# Patient Record
Sex: Female | Born: 1965 | Race: Black or African American | Hispanic: No | Marital: Single | Smoking: Never smoker
Health system: Southern US, Community
[De-identification: ages and names within clinical notes are randomized; demographics above are authoritative.]

## PROBLEM LIST (undated history)

## (undated) DIAGNOSIS — I10 Essential (primary) hypertension: Secondary | ICD-10-CM

## (undated) DIAGNOSIS — E7800 Pure hypercholesterolemia, unspecified: Secondary | ICD-10-CM

## (undated) DIAGNOSIS — E119 Type 2 diabetes mellitus without complications: Secondary | ICD-10-CM

---

## 2019-07-25 ENCOUNTER — Encounter (INDEPENDENT_AMBULATORY_CARE_PROVIDER_SITE_OTHER): Payer: BC Managed Care – PPO | Admitting: Ophthalmology

## 2019-07-25 DIAGNOSIS — H2513 Age-related nuclear cataract, bilateral: Secondary | ICD-10-CM

## 2019-07-25 DIAGNOSIS — E113313 Type 2 diabetes mellitus with moderate nonproliferative diabetic retinopathy with macular edema, bilateral: Secondary | ICD-10-CM | POA: Diagnosis not present

## 2019-07-25 DIAGNOSIS — E11311 Type 2 diabetes mellitus with unspecified diabetic retinopathy with macular edema: Secondary | ICD-10-CM | POA: Diagnosis not present

## 2019-07-25 DIAGNOSIS — H35033 Hypertensive retinopathy, bilateral: Secondary | ICD-10-CM | POA: Diagnosis not present

## 2019-07-25 DIAGNOSIS — H43813 Vitreous degeneration, bilateral: Secondary | ICD-10-CM

## 2019-07-25 DIAGNOSIS — I10 Essential (primary) hypertension: Secondary | ICD-10-CM

## 2019-07-25 DIAGNOSIS — D3132 Benign neoplasm of left choroid: Secondary | ICD-10-CM

## 2019-09-08 ENCOUNTER — Emergency Department (HOSPITAL_BASED_OUTPATIENT_CLINIC_OR_DEPARTMENT_OTHER)
Admission: EM | Admit: 2019-09-08 | Discharge: 2019-09-09 | Disposition: A | Discharge status: Home / Self Care | Payer: BC Managed Care – PPO | Attending: Emergency Medicine | Admitting: Emergency Medicine

## 2019-09-08 ENCOUNTER — Emergency Department (HOSPITAL_BASED_OUTPATIENT_CLINIC_OR_DEPARTMENT_OTHER): Payer: BC Managed Care – PPO

## 2019-09-08 ENCOUNTER — Encounter (HOSPITAL_BASED_OUTPATIENT_CLINIC_OR_DEPARTMENT_OTHER): Payer: Self-pay | Admitting: Emergency Medicine

## 2019-09-08 ENCOUNTER — Other Ambulatory Visit: Payer: Self-pay

## 2019-09-08 DIAGNOSIS — R509 Fever, unspecified: Secondary | ICD-10-CM

## 2019-09-08 DIAGNOSIS — E119 Type 2 diabetes mellitus without complications: Secondary | ICD-10-CM | POA: Insufficient documentation

## 2019-09-08 DIAGNOSIS — U071 COVID-19: Secondary | ICD-10-CM | POA: Diagnosis not present

## 2019-09-08 DIAGNOSIS — I10 Essential (primary) hypertension: Secondary | ICD-10-CM | POA: Insufficient documentation

## 2019-09-08 HISTORY — DX: Pure hypercholesterolemia, unspecified: E78.00

## 2019-09-08 HISTORY — DX: Type 2 diabetes mellitus without complications: E11.9

## 2019-09-08 HISTORY — DX: Essential (primary) hypertension: I10

## 2019-09-08 LAB — CBC WITH DIFFERENTIAL/PLATELET
Abs Immature Granulocytes: 0.01 10*3/uL (ref 0.00–0.07)
Basophils Absolute: 0 10*3/uL (ref 0.0–0.1)
Basophils Relative: 0 %
Eosinophils Absolute: 0 10*3/uL (ref 0.0–0.5)
Eosinophils Relative: 0 %
HCT: 35.4 % — ABNORMAL LOW (ref 36.0–46.0)
Hemoglobin: 11.4 g/dL — ABNORMAL LOW (ref 12.0–15.0)
Immature Granulocytes: 0 %
Lymphocytes Relative: 28 %
Lymphs Abs: 1.4 10*3/uL (ref 0.7–4.0)
MCH: 26.1 pg (ref 26.0–34.0)
MCHC: 32.2 g/dL (ref 30.0–36.0)
MCV: 81 fL (ref 80.0–100.0)
Monocytes Absolute: 0.3 10*3/uL (ref 0.1–1.0)
Monocytes Relative: 5 %
Neutro Abs: 3.2 10*3/uL (ref 1.7–7.7)
Neutrophils Relative %: 67 %
Platelets: 185 10*3/uL (ref 150–400)
RBC: 4.37 MIL/uL (ref 3.87–5.11)
RDW: 12.3 % (ref 11.5–15.5)
WBC: 4.9 10*3/uL (ref 4.0–10.5)
nRBC: 0 % (ref 0.0–0.2)

## 2019-09-08 LAB — COMPREHENSIVE METABOLIC PANEL
ALT: 14 U/L (ref 0–44)
AST: 18 U/L (ref 15–41)
Albumin: 3.8 g/dL (ref 3.5–5.0)
Alkaline Phosphatase: 54 U/L (ref 38–126)
Anion gap: 10 (ref 5–15)
BUN: 22 mg/dL — ABNORMAL HIGH (ref 6–20)
CO2: 24 mmol/L (ref 22–32)
Calcium: 9 mg/dL (ref 8.9–10.3)
Chloride: 103 mmol/L (ref 98–111)
Creatinine, Ser: 0.97 mg/dL (ref 0.44–1.00)
GFR calc Af Amer: >60 mL/min (ref >60)
GFR calc non Af Amer: >60 mL/min (ref >60)
Glucose, Bld: 167 mg/dL — ABNORMAL HIGH (ref 70–99)
Potassium: 4 mmol/L (ref 3.5–5.1)
Sodium: 137 mmol/L (ref 135–145)
Total Bilirubin: 0.4 mg/dL (ref 0.3–1.2)
Total Protein: 7.5 g/dL (ref 6.5–8.1)

## 2019-09-08 LAB — URINALYSIS, ROUTINE W REFLEX MICROSCOPIC
Bilirubin Urine: NEGATIVE
Glucose, UA: NEGATIVE mg/dL
Ketones, ur: NEGATIVE mg/dL
Leukocytes,Ua: NEGATIVE
Nitrite: NEGATIVE
Protein, ur: NEGATIVE mg/dL
Specific Gravity, Urine: 1.025 (ref 1.005–1.030)
pH: 5.5 (ref 5.0–8.0)

## 2019-09-08 LAB — CBG MONITORING, ED: Glucose-Capillary: 127 mg/dL — ABNORMAL HIGH (ref 70–99)

## 2019-09-08 LAB — SARS CORONAVIRUS 2 AG (30 MIN TAT): SARS Coronavirus 2 Ag: POSITIVE — AB

## 2019-09-08 LAB — PREGNANCY, URINE: Preg Test, Ur: NEGATIVE

## 2019-09-08 LAB — LACTIC ACID, PLASMA: Lactic Acid, Venous: 1.4 mmol/L (ref 0.5–1.9)

## 2019-09-08 LAB — URINALYSIS, MICROSCOPIC (REFLEX): WBC, UA: NONE SEEN WBC/hpf (ref 0–5)

## 2019-09-08 MED ORDER — ACETAMINOPHEN 160 MG/5ML PO SOLN
650.0000 mg | Freq: Once | ORAL | Status: AC
  Administered 2019-09-08: 17:00:00 650 mg via ORAL
  Filled 2019-09-08: qty 20.3

## 2019-09-08 MED ORDER — ONDANSETRON 4 MG PO TBDP: 4.0000 mg | ORAL_TABLET | Freq: Three times a day (TID) | ORAL | 0 refills | Status: AC | PRN

## 2019-09-08 MED ORDER — SODIUM CHLORIDE 0.9% FLUSH
3.0000 mL | Freq: Once | INTRAVENOUS | Status: DC
  Filled 2019-09-08: qty 3

## 2019-09-08 MED ORDER — ACETAMINOPHEN 500 MG PO TABS
1000.0000 mg | ORAL_TABLET | Freq: Once | ORAL | Status: AC
  Administered 2019-09-08: 1000 mg via ORAL
  Filled 2019-09-08: qty 2

## 2019-09-08 NOTE — Discharge Instructions (Addendum)
Your COVID test was positive.  I recommended that you self quarantine until you are symptom-free for 5 days.  This may take 2 to 4 weeks from the onset of your symptoms.  You can continue taking Tylenol or Motrin at home for your fevers and muscle aches.  I also prescribed you some Zofran if you need this for nausea.

## 2019-09-08 NOTE — ED Triage Notes (Addendum)
Hx of diabetes. High blood sugar readings at home in the 300s. C/o weakness. Also reports fever and body aches.

## 2019-09-08 NOTE — ED Provider Notes (Signed)
[facility] EMERGENCY DEPARTMENT Provider Note   CSN: [id number] Arrival date & time: 09/08/19  1637     History Chief Complaint  Patient presents with  . Hyperglycemia  . Fever    Cassandra Navarro is a 54 y.o. female   With a history of pancreatic surgery, type 2 diabetes on insulin, presenting to the emergency department with fevers and chills.  She reports symptom onset approximately 5 days ago.  She has been having shaking chills and fevers, feels some congestion and a headache, as well as myalgias.  She reports some intermittent cramping abdominal pain scattered all over.  She denies any nausea or vomiting.  She denies any diarrhea or constipation.  She denies any shortness of breath.  She has a mild cough and a tickle in the back of her throat.  She is concerned her BS was over 300 today.  She did take her novalog as scheduled and is regular with her insulin.   HPI     Past Medical History:  Diagnosis Date  . Diabetes mellitus without complication ([place])   . High cholesterol   . Hypertension     There are no problems to display for this patient.   History reviewed. No pertinent surgical history.   OB History   No obstetric history on file.     No family history on file.  Social History   Tobacco Use  . Smoking status: Never Smoker  . Smokeless tobacco: Never Used  Substance Use Topics  . Alcohol use: Not Currently  . Drug use: Not on file    Home Medications Prior to Admission medications   Medication Sig Start Date End Date Taking? Authorizing Provider  ondansetron (ZOFRAN ODT) 4 MG disintegrating tablet Take 1 tablet (4 mg total) by mouth every 8 (eight) hours as needed for up to 20 doses for nausea or vomiting. 09/08/19   Wyvonnia Dusky, MD    Allergies    Patient has no known allergies.  Review of Systems   Review of Systems  Constitutional: Positive for chills, fatigue and fever.  HENT: Positive for congestion. Negative for sore  throat.   Eyes: Negative for photophobia and visual disturbance.  Respiratory: Negative for cough and shortness of breath.   Cardiovascular: Negative for chest pain and palpitations.  Gastrointestinal: Positive for abdominal pain. Negative for vomiting.  Genitourinary: Negative for dysuria and hematuria.  Musculoskeletal: Positive for arthralgias and myalgias.  Skin: Negative for color change and rash.  Neurological: Positive for headaches. Negative for syncope.  Psychiatric/Behavioral: Negative for agitation and confusion.  All other systems reviewed and are negative.   Physical Exam Updated Vital Signs BP (!) 166/84 (BP Location: Right Arm)   Pulse (!) 106   Temp (!) 101.2 F (38.4 C) (Oral)   Resp 20   Ht 5\' 7"  (1.702 m)   Wt 72.6 kg   SpO2 97%   BMI 25.06 kg/m   Physical Exam Vitals and nursing note reviewed.  Constitutional:      General: She is not in acute distress.    Appearance: She is well-developed.  HENT:     Head: Normocephalic and atraumatic.  Eyes:     Conjunctiva/sclera: Conjunctivae normal.  Cardiovascular:     Rate and Rhythm: Normal rate and regular rhythm.  Pulmonary:     Effort: Pulmonary effort is normal. No respiratory distress.  Abdominal:     General: There is no distension.     Palpations: Abdomen is soft. There  is no mass.     Tenderness: There is no abdominal tenderness.     Hernia: No hernia is present.  Musculoskeletal:     Cervical back: Neck supple.  Skin:    General: Skin is warm and dry.  Neurological:     Mental Status: She is alert.  Psychiatric:        Mood and Affect: Mood normal.        Behavior: Behavior normal.     ED Results / Procedures / Treatments   Labs (all labs ordered are listed, but only abnormal results are displayed) Labs Reviewed  SARS CORONAVIRUS 2 AG (30 MIN TAT) - Abnormal; Notable for the following components:      Result Value   SARS Coronavirus 2 Ag POSITIVE (*)    All other components within  normal limits  COMPREHENSIVE METABOLIC PANEL - Abnormal; Notable for the following components:   Glucose, Bld 167 (*)    BUN 22 (*)    All other components within normal limits  CBC WITH DIFFERENTIAL/PLATELET - Abnormal; Notable for the following components:   Hemoglobin 11.4 (*)    HCT 35.4 (*)    All other components within normal limits  URINALYSIS, ROUTINE W REFLEX MICROSCOPIC - Abnormal; Notable for the following components:   APPearance HAZY (*)    Hgb urine dipstick SMALL (*)    All other components within normal limits  URINALYSIS, MICROSCOPIC (REFLEX) - Abnormal; Notable for the following components:   Bacteria, UA RARE (*)    All other components within normal limits  CBG MONITORING, ED - Abnormal; Notable for the following components:   Glucose-Capillary 127 (*)    All other components within normal limits  LACTIC ACID, PLASMA  PREGNANCY, URINE  CBG MONITORING, ED    EKG None  Radiology DG Chest Port 1 View  Result Date: 09/08/2019 CLINICAL DATA:  Fever. EXAM: PORTABLE CHEST 1 VIEW COMPARISON:  None. FINDINGS: There is a faint airspace opacity in the right mid lung zone. There is no pneumothorax. No large pleural effusion. There are bronchitic changes at the lung bases. There is no acute osseous abnormality. The heart size is normal. IMPRESSION: No active disease. Electronically Signed   By: Constance Holster M.D.   On: 09/08/2019 23:31    Procedures Procedures (including critical care time)  Medications Ordered in ED Medications  sodium chloride flush (NS) 0.9 % injection 3 mL (3 mLs Intravenous Not Given 09/08/19 2353)  acetaminophen (TYLENOL) 160 MG/5ML solution 650 mg (650 mg Oral Given 09/08/19 1656)  acetaminophen (TYLENOL) tablet 1,000 mg (1,000 mg Oral Given 09/08/19 2315)    ED Course  I have reviewed the triage vital signs and the nursing notes.  Pertinent labs & imaging results that were available during my care of the patient were reviewed by me and  considered in my medical decision making (see chart for details).  54 yo female presenting to ED with fevers, chills x 4-5 days, also with congestion, abdominal pain and myalgias.  Febrile here.  Rapid covid test positive.  Suspect this is likely the cause of her symptoms  Lower suspicion clinically for meningitis, bacterial PNA, appendicitis, or acute intraabdominal infection or surgical emergency.  She has a benign abdominal exam. UA does not appear infectious.  No sign of DKA, and BS only mildly elevated here.  No hypoxia or evidence of respiratory failure.  We discussed expectations with COVID and prolonged course of illness and fevers possible.  Will discharge  Kendell Starin was evaluated in Emergency Department on 09/09/2019 for the symptoms described in the history of present illness. She was evaluated in the context of the global COVID-19 pandemic, which necessitated consideration that the patient might be at risk for infection with the SARS-CoV-2 virus that causes COVID-19. Institutional protocols and algorithms that pertain to the evaluation of patients at risk for COVID-19 are in a state of rapid change based on information released by regulatory bodies including the CDC and federal and state organizations. These policies and algorithms were followed during the patient's care in the ED.  This note was dictated using dragon dictation software.  Please be aware that there may be minor translation errors as a result of this oral dictation  Final Clinical Impression(s) / ED Diagnoses Final diagnoses:  COVID-19    Rx / DC Orders ED Discharge Orders         Ordered    ondansetron (ZOFRAN ODT) 4 MG disintegrating tablet  Every 8 hours PRN     09/08/19 2347           Wyvonnia Dusky, MD 09/09/19 0117

## 2019-09-08 NOTE — ED Notes (Signed)
PO fluids in lobby

## 2019-09-10 ENCOUNTER — Other Ambulatory Visit: Payer: Self-pay | Admitting: Nurse Practitioner

## 2019-09-10 DIAGNOSIS — U071 COVID-19: Secondary | ICD-10-CM

## 2019-09-10 DIAGNOSIS — E119 Type 2 diabetes mellitus without complications: Secondary | ICD-10-CM

## 2019-09-10 NOTE — Progress Notes (Signed)
  I connected by phone with Cassandra Navarro on 09/10/2019 at 7:41 AM to discuss the potential use of an new treatment for mild to moderate COVID-19 viral infection in non-hospitalized patients.  This patient is a 54 y.o. female that meets the FDA criteria for Emergency Use Authorization of bamlanivimab or casirivimab\imdevimab.  Has a (+) direct SARS-CoV-2 viral test result  Has mild or moderate COVID-19   Is ? 56 years of age and weighs ? 40 kg  Is NOT hospitalized due to COVID-19  Is NOT requiring oxygen therapy or requiring an increase in baseline oxygen flow rate due to COVID-19  Is within 10 days of symptom onset  Has at least one of the high risk factor(s) for progression to severe COVID-19 and/or hospitalization as defined in EUA.  Specific high risk criteria : Diabetes   I have spoken and communicated the following to the patient or parent/caregiver:  1. FDA has authorized the emergency use of bamlanivimab and casirivimab\imdevimab for the treatment of mild to moderate COVID-19 in adults and pediatric patients with positive results of direct SARS-CoV-2 viral testing who are 72 years of age and older weighing at least 40 kg, and who are at high risk for progressing to severe COVID-19 and/or hospitalization.  2. The significant known and potential risks and benefits of bamlanivimab and casirivimab\imdevimab, and the extent to which such potential risks and benefits are unknown.  3. Information on available alternative treatments and the risks and benefits of those alternatives, including clinical trials.  4. Patients treated with bamlanivimab and casirivimab\imdevimab should continue to self-isolate and use infection control measures (e.g., wear mask, isolate, social distance, avoid sharing personal items, clean and disinfect "high touch" surfaces, and frequent handwashing) according to CDC guidelines.   5. The patient or parent/caregiver has the option to accept or refuse  bamlanivimab or casirivimab\imdevimab .  After reviewing this information with the patient, The patient agreed to proceed with receiving the bamlanimivab infusion and will be provided a copy of the Fact sheet prior to receiving the infusion.[name] 09/10/2019 7:41 AM

## 2019-09-12 ENCOUNTER — Ambulatory Visit (HOSPITAL_COMMUNITY)
Admission: RE | Admit: 2019-09-12 | Discharge: 2019-09-12 | Disposition: A | Discharge status: Home / Self Care | Payer: BC Managed Care – PPO | Source: Ambulatory Visit | Attending: Pulmonary Disease | Admitting: Pulmonary Disease

## 2019-09-12 DIAGNOSIS — E119 Type 2 diabetes mellitus without complications: Secondary | ICD-10-CM

## 2019-09-12 DIAGNOSIS — Z794 Long term (current) use of insulin: Secondary | ICD-10-CM | POA: Insufficient documentation

## 2019-09-12 DIAGNOSIS — U071 COVID-19: Secondary | ICD-10-CM | POA: Insufficient documentation

## 2019-09-12 MED ORDER — METHYLPREDNISOLONE SODIUM SUCC 125 MG IJ SOLR: 125.0000 mg | Freq: Once | INTRAMUSCULAR | Status: DC | PRN

## 2019-09-12 MED ORDER — ALBUTEROL SULFATE HFA 108 (90 BASE) MCG/ACT IN AERS: 2.0000 | INHALATION_SPRAY | Freq: Once | RESPIRATORY_TRACT | Status: DC | PRN

## 2019-09-12 MED ORDER — EPINEPHRINE 0.3 MG/0.3ML IJ SOAJ: 0.3000 mg | Freq: Once | INTRAMUSCULAR | Status: DC | PRN

## 2019-09-12 MED ORDER — SODIUM CHLORIDE 0.9 % IV SOLN
INTRAVENOUS | Status: DC | PRN
  Administered 2019-09-12: 250 mL via INTRAVENOUS

## 2019-09-12 MED ORDER — DIPHENHYDRAMINE HCL 50 MG/ML IJ SOLN: 50.0000 mg | Freq: Once | INTRAMUSCULAR | Status: DC | PRN

## 2019-09-12 MED ORDER — FAMOTIDINE IN NACL 20-0.9 MG/50ML-% IV SOLN: 20.0000 mg | Freq: Once | INTRAVENOUS | Status: DC | PRN

## 2019-09-12 MED ORDER — SODIUM CHLORIDE 0.9 % IV SOLN
700.0000 mg | Freq: Once | INTRAVENOUS | Status: AC
  Administered 2019-09-12: 700 mg via INTRAVENOUS
  Filled 2019-09-12: qty 20

## 2019-09-12 NOTE — Discharge Instructions (Signed)

## 2019-09-12 NOTE — Progress Notes (Signed)
.   Diagnosis: COVID-19  Physician: Dr. Joya Gaskins  Procedure: [facility] Med: bamlanivimab infusion - Provided patient with bamlanimivab fact sheet for patients, parents and caregivers prior to infusion.  Complications: No immediate complications noted.  Discharge: Discharged home   Cassandra Navarro 09/12/2019

## 2019-09-13 ENCOUNTER — Ambulatory Visit (HOSPITAL_COMMUNITY): Payer: BC Managed Care – PPO

## 2021-07-04 IMAGING — DX DG CHEST 1V PORT
1 series · 1 of 1 positions shown · non-contrast
Comparison: None.

CLINICAL DATA: Fever.

EXAM:
PORTABLE CHEST 1 VIEW

[chest ap]
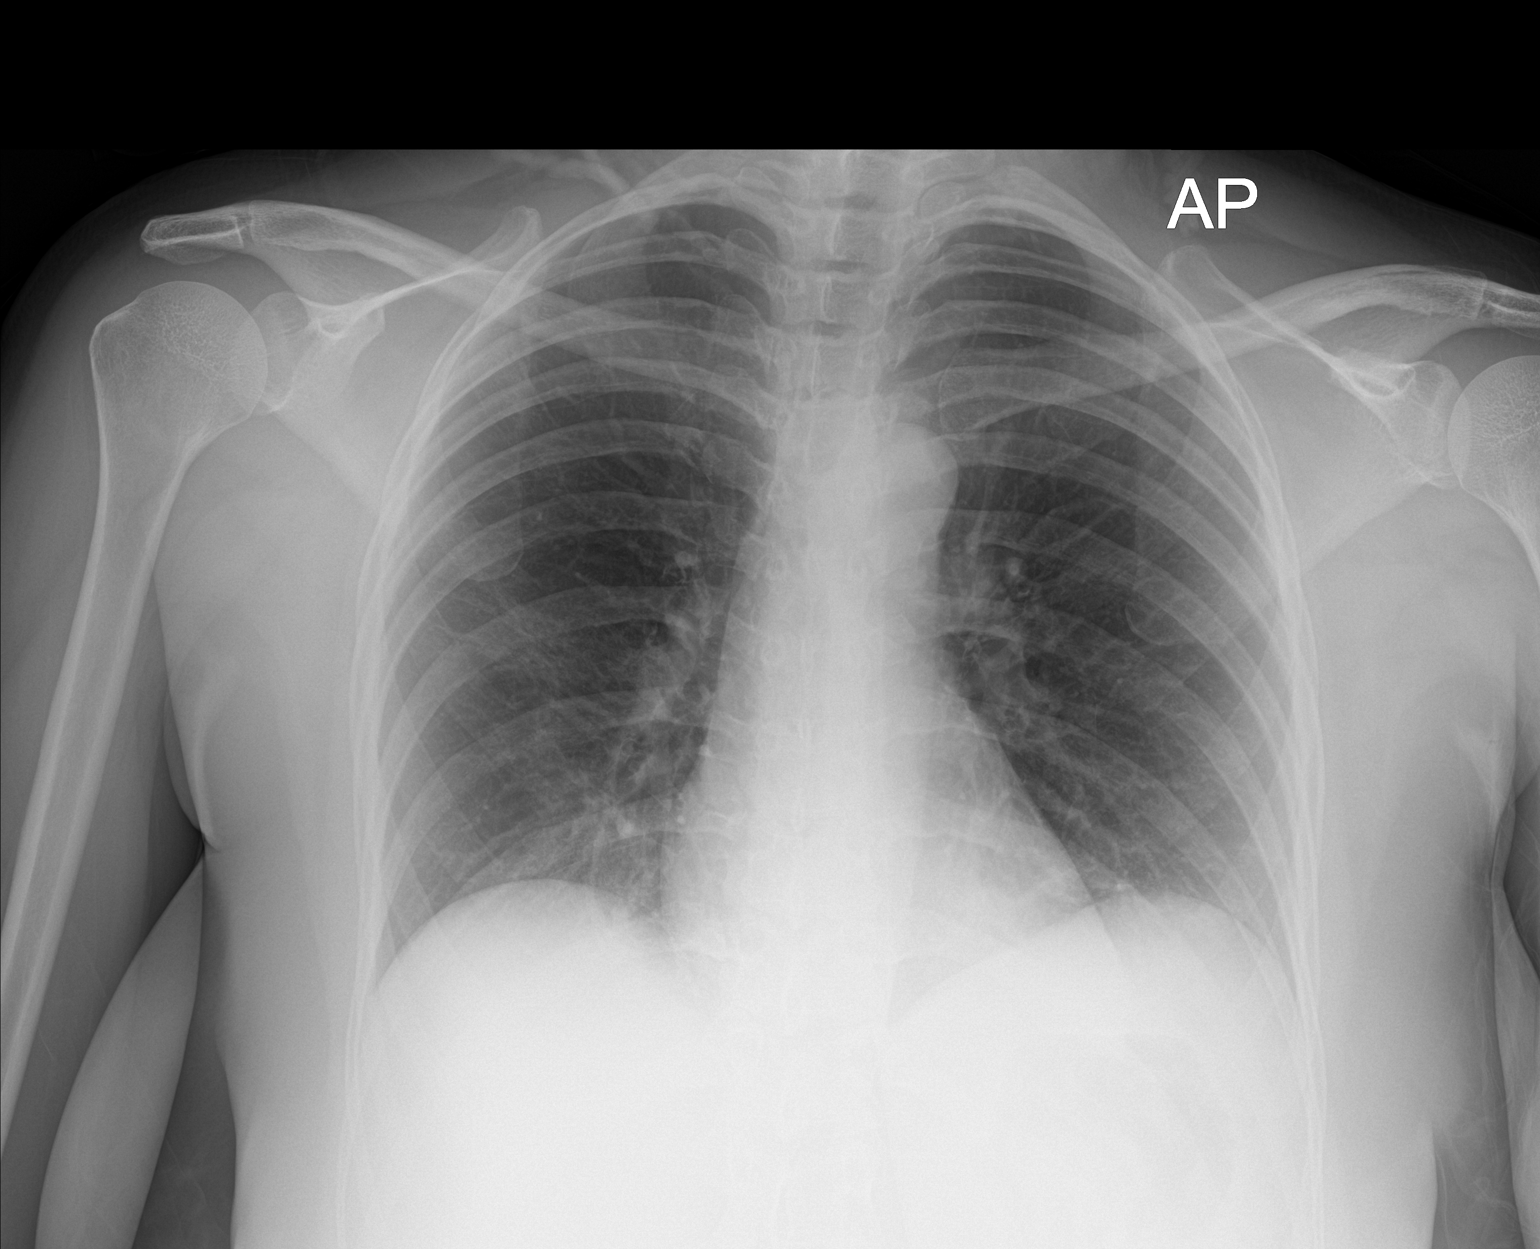

[1 of 1 positions shown; findings below may reference images not displayed]

FINDINGS: There is a faint airspace opacity in the right mid lung zone. There
is no pneumothorax. No large pleural effusion. There are bronchitic
changes at the lung bases. There is no acute osseous abnormality.
The heart size is normal.
IMPRESSION: No active disease.
